# Patient Record
Sex: Male | Born: 1965 | Race: White | Hispanic: No | Smoking: Never smoker
Health system: Southern US, Community
[De-identification: ages and names within clinical notes are randomized; demographics above are authoritative.]

## PROBLEM LIST (undated history)

## (undated) DIAGNOSIS — F32A Depression, unspecified: Secondary | ICD-10-CM

---

## 2004-03-11 ENCOUNTER — Ambulatory Visit: Payer: Self-pay | Admitting: Internal Medicine

## 2004-03-16 ENCOUNTER — Ambulatory Visit: Payer: Self-pay | Admitting: Internal Medicine

## 2005-05-31 ENCOUNTER — Ambulatory Visit: Payer: Self-pay | Admitting: Internal Medicine

## 2005-08-25 ENCOUNTER — Ambulatory Visit: Payer: Self-pay | Admitting: Internal Medicine

## 2006-05-17 ENCOUNTER — Ambulatory Visit: Payer: Self-pay | Admitting: Internal Medicine

## 2006-05-24 ENCOUNTER — Ambulatory Visit: Payer: Self-pay | Admitting: Internal Medicine

## 2007-02-18 ENCOUNTER — Telehealth: Payer: Self-pay | Admitting: Internal Medicine

## 2008-01-20 ENCOUNTER — Ambulatory Visit: Payer: Self-pay | Admitting: Family Medicine

## 2008-01-20 DIAGNOSIS — J309 Allergic rhinitis, unspecified: Secondary | ICD-10-CM | POA: Insufficient documentation

## 2009-05-11 ENCOUNTER — Ambulatory Visit: Payer: Self-pay | Admitting: Internal Medicine

## 2009-05-11 DIAGNOSIS — J029 Acute pharyngitis, unspecified: Secondary | ICD-10-CM | POA: Insufficient documentation

## 2009-05-11 DIAGNOSIS — J069 Acute upper respiratory infection, unspecified: Secondary | ICD-10-CM | POA: Insufficient documentation

## 2010-02-24 NOTE — Assessment & Plan Note (Signed)
Summary: ? strep//ccm   Vital Signs:  Patient profile:   45 year old male Weight:      189 pounds Temp:     98.8 degrees F oral Pulse rate:   70 / minute Pulse rhythm:   regular Resp:     12 per minute BP sitting:   118 / 72  (left arm) Cuff size:   regular  Vitals Entered By: Gladis Riffle, RN (May 11, 2009 9:35 AM) CC: c/o sore throat since 05/08/09--also cough beginning but worse 4/17-4/18--now also head congestion Is Patient Diabetic? No   CC:  c/o sore throat since 05/08/09--also cough beginning but worse 4/17-4/18--now also head congestion.  Preventive Screening-Counseling & Management  Alcohol-Tobacco     Smoking Status: never  Current Medications (verified): 1)  Lexapro 10 Mg  Tabs (Escitalopram Oxalate) .... Take 1 Tablet By Mouth Once A Day 2)  Wellbutrin 100 Mg  Tabs (Bupropion Hcl) .... Take 1 Tablet By Mouth Once A Day  Allergies (verified): No Known Drug Allergies  Physical Exam  General:  Well-developed,well-nourished,in no acute distress; alert,appropriate and cooperative throughout examination Head:  normocephalic and atraumatic.   Eyes:  pupils equal and pupils round.   Ears:  R ear normal and L ear normal.   Mouth:  pharyngeal erythema Neck:  No deformities, masses, or tenderness noted. Cervical Nodes:  no anterior cervical adenopathy and no posterior cervical adenopathy.     Impression & Recommendations:  Problem # 1:  URI (ICD-465.9) check strep screen. if negative no treatment if positive then antibiotic  Complete Medication List: 1)  Lexapro 10 Mg Tabs (Escitalopram oxalate) .... Take 1 tablet by mouth once a day 2)  Wellbutrin 100 Mg Tabs (Bupropion hcl) .... Take 1 tablet by mouth once a day  Other Orders: Rapid Strep (81191)  Appended Document: ? strep//ccm  Laboratory Results    Other Tests  Rapid Strep: negative Comments: Rita Ohara  May 11, 2009 10:41 AM   Kit Test Internal QC: Negative   (Normal Range:  Negative)  Patient notified.

## 2010-12-22 ENCOUNTER — Telehealth: Payer: Self-pay | Admitting: Internal Medicine

## 2010-12-22 NOTE — Telephone Encounter (Signed)
Pt called and has had a sinus inf on lft side for 2 wks. Req work in [employer] or abx called in to [facility] at [facility].

## 2010-12-23 MED ORDER — DOXYCYCLINE HYCLATE 100 MG PO TABS: 100.0000 mg | ORAL_TABLET | Freq: Two times a day (BID) | ORAL | Status: AC

## 2010-12-23 NOTE — Telephone Encounter (Signed)
Doxycycline 100 mg po bid for 10 days. 

## 2010-12-23 NOTE — Telephone Encounter (Signed)
Rx sent to pharmacy.  patient  Is aware.

## 2014-03-12 ENCOUNTER — Emergency Department (HOSPITAL_COMMUNITY): Payer: BLUE CROSS/BLUE SHIELD

## 2014-03-12 ENCOUNTER — Encounter (HOSPITAL_COMMUNITY): Payer: Self-pay | Admitting: *Deleted

## 2014-03-12 ENCOUNTER — Emergency Department (HOSPITAL_COMMUNITY)
Admission: EM | Admit: 2014-03-12 | Discharge: 2014-03-12 | Disposition: A | Discharge status: Home / Self Care | Payer: BLUE CROSS/BLUE SHIELD | Attending: Emergency Medicine | Admitting: Emergency Medicine

## 2014-03-12 DIAGNOSIS — Z79899 Other long term (current) drug therapy: Secondary | ICD-10-CM | POA: Insufficient documentation

## 2014-03-12 DIAGNOSIS — R109 Unspecified abdominal pain: Secondary | ICD-10-CM | POA: Diagnosis present

## 2014-03-12 DIAGNOSIS — N201 Calculus of ureter: Secondary | ICD-10-CM | POA: Insufficient documentation

## 2014-03-12 LAB — URINALYSIS, ROUTINE W REFLEX MICROSCOPIC
Bilirubin Urine: NEGATIVE
Glucose, UA: NEGATIVE mg/dL
Ketones, ur: 15 mg/dL — AB
Leukocytes, UA: NEGATIVE
NITRITE: NEGATIVE
PROTEIN: 30 mg/dL — AB
SPECIFIC GRAVITY, URINE: 1.018 (ref 1.005–1.030)
UROBILINOGEN UA: 0.2 mg/dL (ref 0.0–1.0)
pH: 8 (ref 5.0–8.0)

## 2014-03-12 LAB — CBC WITH DIFFERENTIAL/PLATELET
BASOS ABS: 0 10*3/uL (ref 0.0–0.1)
Basophils Relative: 0 % (ref 0–1)
EOS ABS: 0.2 10*3/uL (ref 0.0–0.7)
EOS PCT: 3 % (ref 0–5)
HEMATOCRIT: 43 % (ref 39.0–52.0)
Hemoglobin: 15.2 g/dL (ref 13.0–17.0)
LYMPHS ABS: 3.3 10*3/uL (ref 0.7–4.0)
Lymphocytes Relative: 50 % — ABNORMAL HIGH (ref 12–46)
MCH: 31.9 pg (ref 26.0–34.0)
MCHC: 35.3 g/dL (ref 30.0–36.0)
MCV: 90.3 fL (ref 78.0–100.0)
MONO ABS: 0.8 10*3/uL (ref 0.1–1.0)
Monocytes Relative: 11 % (ref 3–12)
Neutro Abs: 2.4 10*3/uL (ref 1.7–7.7)
Neutrophils Relative %: 36 % — ABNORMAL LOW (ref 43–77)
PLATELETS: 220 10*3/uL (ref 150–400)
RBC: 4.76 MIL/uL (ref 4.22–5.81)
RDW: 12.6 % (ref 11.5–15.5)
WBC: 6.8 10*3/uL (ref 4.0–10.5)

## 2014-03-12 LAB — I-STAT CHEM 8, ED
BUN: 23 mg/dL (ref 6–23)
CREATININE: 1.2 mg/dL (ref 0.50–1.35)
Calcium, Ion: 1.06 mmol/L — ABNORMAL LOW (ref 1.12–1.23)
Chloride: 104 mmol/L (ref 96–112)
Glucose, Bld: 124 mg/dL — ABNORMAL HIGH (ref 70–99)
HCT: 46 % (ref 39.0–52.0)
Hemoglobin: 15.6 g/dL (ref 13.0–17.0)
POTASSIUM: 3.9 mmol/L (ref 3.5–5.1)
Sodium: 141 mmol/L (ref 135–145)
TCO2: 21 mmol/L (ref 0–100)

## 2014-03-12 LAB — URINE MICROSCOPIC-ADD ON

## 2014-03-12 MED ORDER — ONDANSETRON HCL 4 MG/2ML IJ SOLN
4.0000 mg | Freq: Once | INTRAMUSCULAR | Status: AC
  Administered 2014-03-12: 4 mg via INTRAVENOUS
  Filled 2014-03-12: qty 2

## 2014-03-12 MED ORDER — KETOROLAC TROMETHAMINE 30 MG/ML IJ SOLN
30.0000 mg | Freq: Once | INTRAMUSCULAR | Status: AC
  Administered 2014-03-12: 30 mg via INTRAVENOUS
  Filled 2014-03-12: qty 1

## 2014-03-12 MED ORDER — HYDROMORPHONE HCL 1 MG/ML IJ SOLN
1.0000 mg | Freq: Once | INTRAMUSCULAR | Status: AC
  Administered 2014-03-12: 1 mg via INTRAVENOUS
  Filled 2014-03-12: qty 1

## 2014-03-12 MED ORDER — ONDANSETRON 8 MG PO TBDP: 8.0000 mg | ORAL_TABLET | Freq: Three times a day (TID) | ORAL | Status: AC | PRN

## 2014-03-12 MED ORDER — OXYCODONE-ACETAMINOPHEN 5-325 MG PO TABS: 1.0000 | ORAL_TABLET | Freq: Four times a day (QID) | ORAL | Status: AC | PRN

## 2014-03-12 NOTE — ED Provider Notes (Signed)
CSN: [id number]     Arrival date & time 03/12/14  [id number] History   First MD Initiated Contact with Patient 03/12/14 [phone]     Chief Complaint  Patient presents with  . Flank Pain     (Consider location/radiation/quality/duration/timing/severity/associated sxs/prior Treatment) Patient is a 49 y.o. male presenting with flank pain. The history is provided by the patient.  Flank Pain This is a new problem. Pertinent negatives include no chest pain, no abdominal pain, no headaches and no shortness of breath.   patient presents with right flank pain that began this morning. Start in the right flank and now is more in his abdomen. It is somewhat worse with movement. He states he had no difficulty urinating this morning when he normally does have some difficulty urinating. He has had nausea. Previous history kidney stones. States in August she had a CT scan at [facility] that showed kidney stones on the right side. States he is passed one since then. No fevers. The pain is sharp. It is somewhat worse with movement.  History reviewed. No pertinent past medical history. history of kidney stones History reviewed. No pertinent past surgical history. No family history on file. History  Substance Use Topics  . Smoking status: Never Smoker   . Smokeless tobacco: Not on file  . Alcohol Use: No    Review of Systems  Constitutional: Negative for activity change and appetite change.  Eyes: Negative for pain.  Respiratory: Negative for chest tightness and shortness of breath.   Cardiovascular: Negative for chest pain and leg swelling.  Gastrointestinal: Positive for nausea. Negative for vomiting, abdominal pain and diarrhea.  Genitourinary: Positive for dysuria and flank pain.  Musculoskeletal: Negative for back pain and neck stiffness.  Skin: Negative for rash.  Neurological: Negative for weakness, numbness and headaches.  Psychiatric/Behavioral: Negative for behavioral problems.      Allergies  Review  of patient's allergies indicates no known allergies.  Home Medications   Prior to Admission medications   Medication Sig Start Date End Date Taking? Authorizing Provider  buPROPion (WELLBUTRIN XL) 300 MG 24 hr tablet Take 300 mg by mouth daily.   Yes Historical Provider, MD  escitalopram (LEXAPRO) 20 MG tablet Take 20 mg by mouth daily.   Yes Historical Provider, MD  Multiple Vitamins-Minerals (CENTRUM SILVER PO) Take 0.5 tablets by mouth daily.   Yes Historical Provider, MD  ondansetron (ZOFRAN-ODT) 8 MG disintegrating tablet Take 1 tablet (8 mg total) by mouth every 8 (eight) hours as needed for nausea or vomiting. 03/12/14   Juliet RudeNathan R. Zeferino Mounts, MD  oxyCODONE-acetaminophen (PERCOCET/ROXICET) 5-325 MG per tablet Take 1-2 tablets by mouth every 6 (six) hours as needed for severe pain. 03/12/14   Juliet RudeNathan R. Cassidie Veiga, MD   BP 118/70 mmHg  Pulse 79  Temp(Src) 97.6 F (36.4 C) (Oral)  Resp 16  SpO2 99% Physical Exam  Constitutional: He is oriented to person, place, and time. He appears well-developed and well-nourished.  Patient appears uncomfortable  HENT:  Head: Normocephalic and atraumatic.  Eyes: EOM are normal. Pupils are equal, round, and reactive to light.  Neck: Normal range of motion. Neck supple.  Cardiovascular: Normal rate, regular rhythm and normal heart sounds.   No murmur heard. Pulmonary/Chest: Effort normal and breath sounds normal.  Abdominal: Soft. Bowel sounds are normal. He exhibits no distension and no mass. There is tenderness. There is no rebound and no guarding.  Right-sided abdominal tenderness worse in the mid to lower abdomen.  Genitourinary:  No testicular  tenderness. CVA tenderness on right.  Musculoskeletal: Normal range of motion. He exhibits no edema.  Neurological: He is alert and oriented to person, place, and time. No cranial nerve deficit.  Skin: Skin is warm and dry.  Psychiatric: He has a normal mood and affect.  Nursing note and vitals  reviewed.   ED Course  Procedures (including critical care time) Labs Review Labs Reviewed  CBC WITH DIFFERENTIAL/PLATELET - Abnormal; Notable for the following:    Neutrophils Relative % 36 (*)    Lymphocytes Relative 50 (*)    All other components within normal limits  URINALYSIS, ROUTINE W REFLEX MICROSCOPIC - Abnormal; Notable for the following:    APPearance CLOUDY (*)    Hgb urine dipstick LARGE (*)    Ketones, ur 15 (*)    Protein, ur 30 (*)    All other components within normal limits  URINE MICROSCOPIC-ADD ON - Abnormal; Notable for the following:    Bacteria, UA FEW (*)    Crystals CA OXALATE CRYSTALS (*)    All other components within normal limits  I-STAT CHEM 8, ED - Abnormal; Notable for the following:    Glucose, Bld 124 (*)    Calcium, Ion 1.06 (*)    All other components within normal limits    Imaging Review Dg Abd 1 View  03/12/2014   CLINICAL DATA:  Right flank pain and history of renal calculi.  EXAM: ABDOMEN - 1 VIEW  COMPARISON:  CT of the abdomen and pelvis on 05/26/2010  FINDINGS: Normal bowel gas pattern. No definite renal, ureteral or bladder calculi are identified by x-ray. No soft tissue abnormalities. Visualized bony structures are unremarkable.  IMPRESSION: Normal abdominal film.  No visualized calculi.   Electronically Signed   By: Irish Lack M.D.   On: 03/12/2014 09:20   US Renal  03/12/2014   CLINICAL DATA:  Right flank pain for 2 hr and history of kidney stones  EXAM: RENAL/URINARY TRACT ULTRASOUND COMPLETE  COMPARISON:  None.  FINDINGS: Right Kidney:  Length: 11 cm.  There is mild hydronephrosis without visible cause.  14 mm predominantly hypoechoic lesion in the upper cortex with thin nonvascular septations. Despite echogenic interfaces, no calcification was seen in a region of presumed cyst on 2012 abdominal CT.  Left Kidney:  Length: 11 cm. Echogenicity within normal limits. No solid mass or hydronephrosis visualized. There is an incidental  exophytic 12 mm cyst from the upper pole.  Bladder:  Appears normal for degree of bladder distention. Bilateral jets were not visualized.  IMPRESSION: 1. Mild right hydronephrosis. 2. 14 mm multi septated cyst in the upper pole right kidney. The cyst has not grown significantly since 2012 abdominal CT and is likely benign, but requires follow-up. Recommend renal MRI to establish baseline.   Electronically Signed   By: Marnee Spring M.D.   On: 03/12/2014 10:01     EKG Interpretation None      MDM   Final diagnoses:  Right ureteral stone    Patient with flank pain. Previous kidney stones and had CT scan in August showed right renal stone. Ultrasound shows hydronephrosis however no stone visualized on KUB. Pain feels much better after treatment. No infection in good renal function. Will discharge home to follow-up with urology as needed. Also has lesion on kidney that will need to be followed. Patient was artery aware of this and states she will follow-up.    Juliet Rude. Rubin Payor, MD 03/12/14 [phone]

## 2014-03-12 NOTE — ED Notes (Signed)
Urine strainer given to pt on discharge.

## 2014-03-12 NOTE — ED Notes (Signed)
Patient transported to X-ray 

## 2014-03-12 NOTE — ED Notes (Signed)
Pt presents via POV c/o right lower flank pain radiating into the right groin beginning this morning.  Pt reports hx of kidney stones.  Pt denies blood in urine.  Pt ax4.

## 2014-03-12 NOTE — Discharge Instructions (Signed)

## 2016-04-29 IMAGING — US US RENAL
1 series · 14 of 25 positions shown · non-contrast
Comparison: None.

CLINICAL DATA: Right flank pain for 2 hr and history of kidney
stones

EXAM:
RENAL/URINARY TRACT ULTRASOUND COMPLETE

[Series 1: us renal · 0.18mm/px · 14 of 51 slices shown]
[im 1/51]
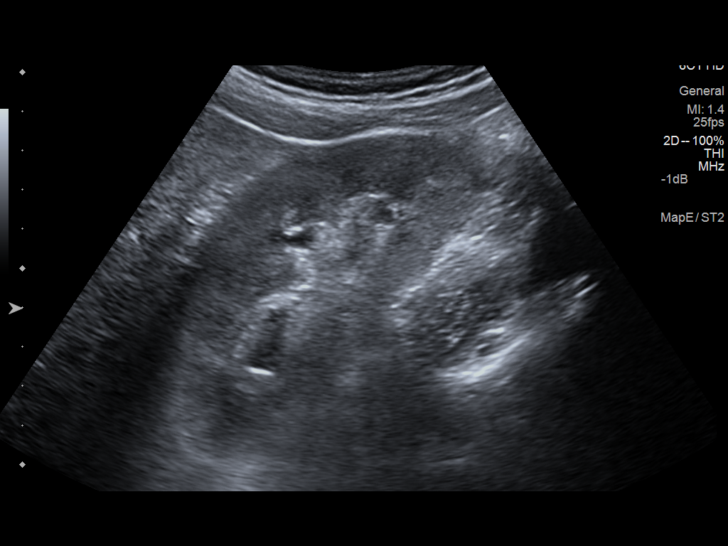
[im 5/51]
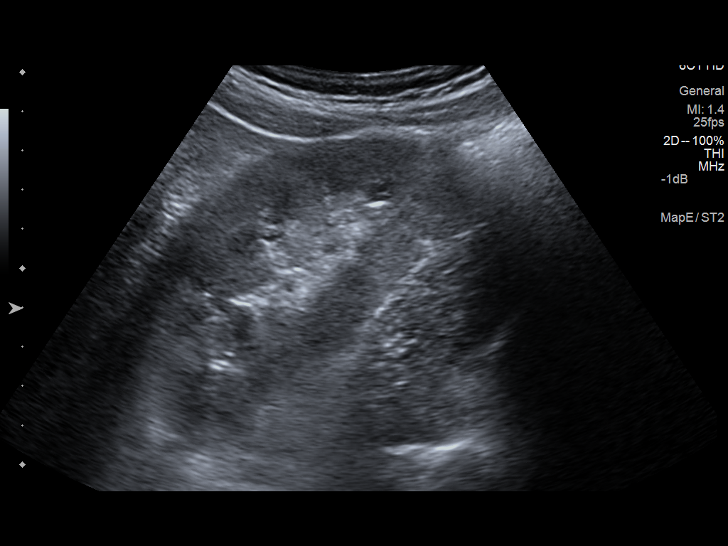
[im 9/51]
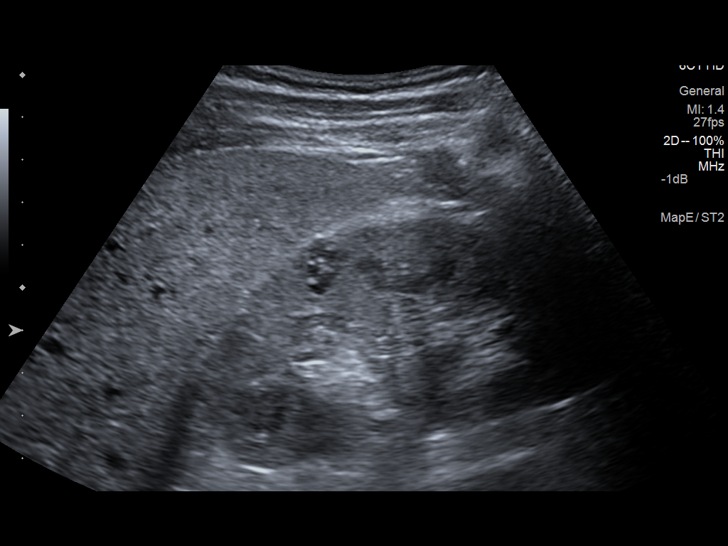
[im 13/51]
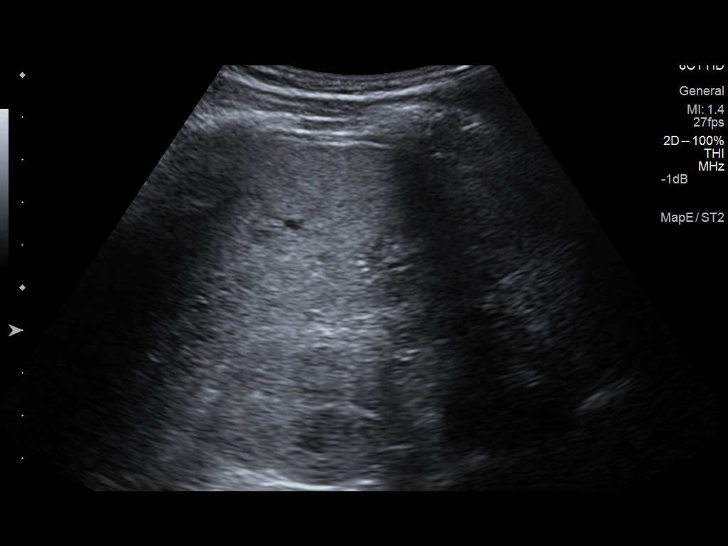
[im 17/51]
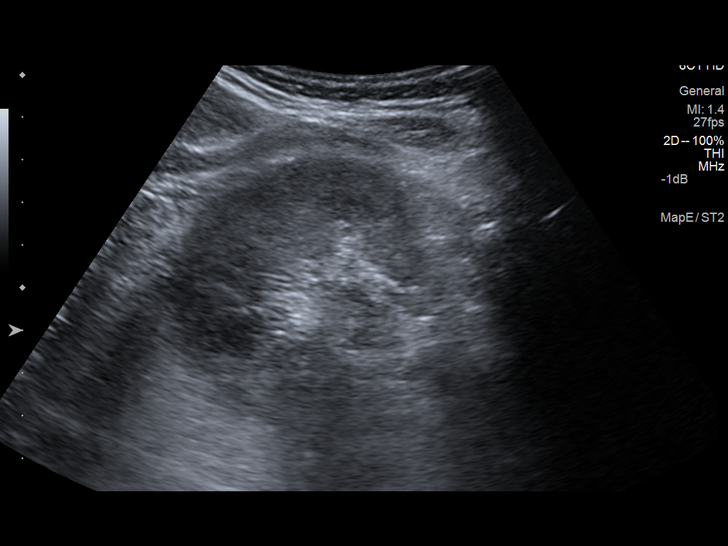
[im 19/51]
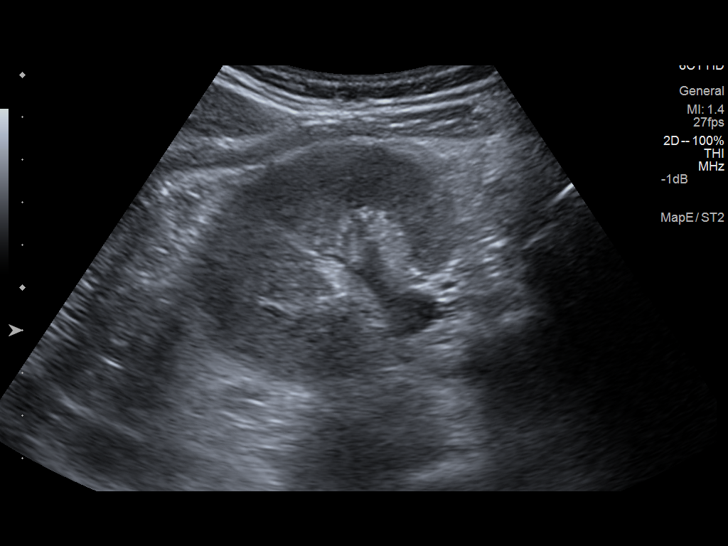
[im 23/51]
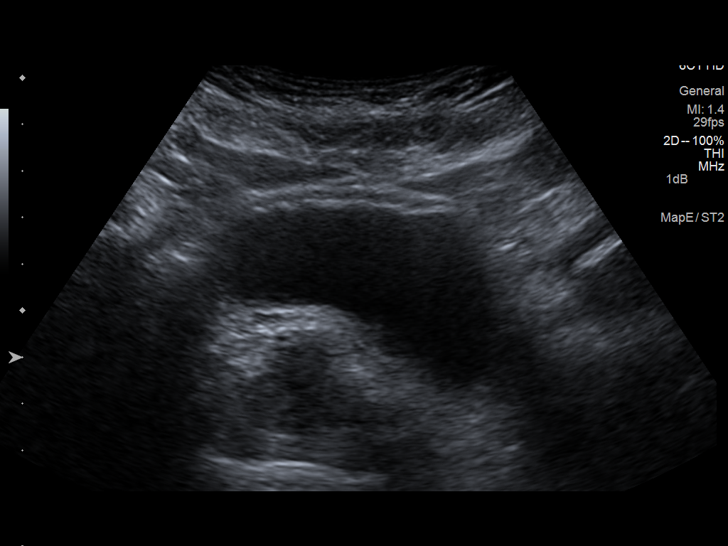
[im 28/51]
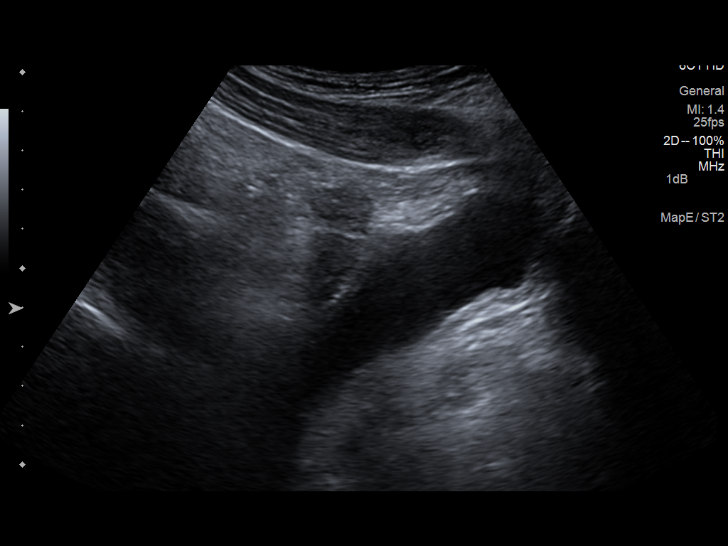
[im 32/51]
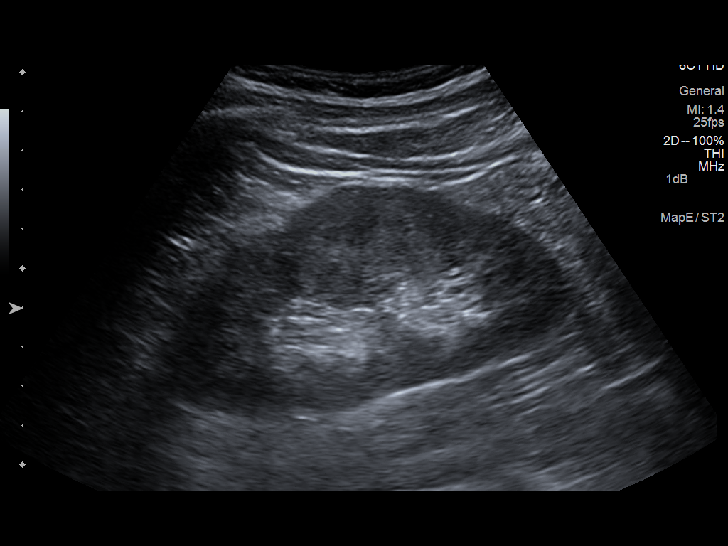
[im 34/51]
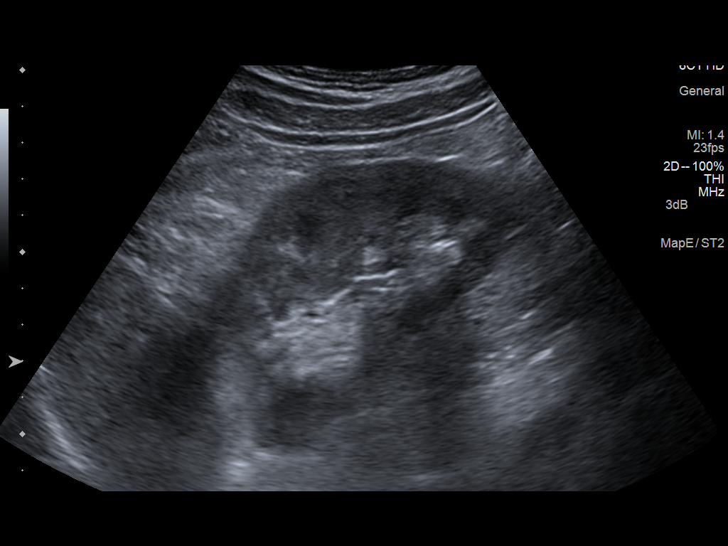
[im 38/51]
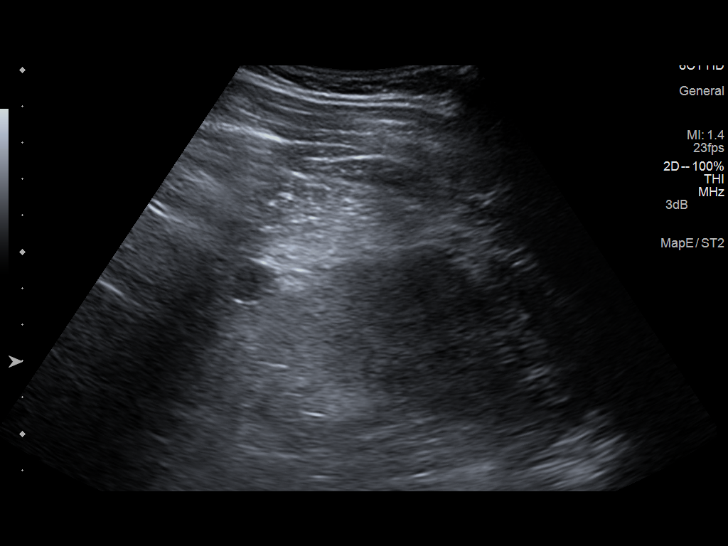
[im 42/51]
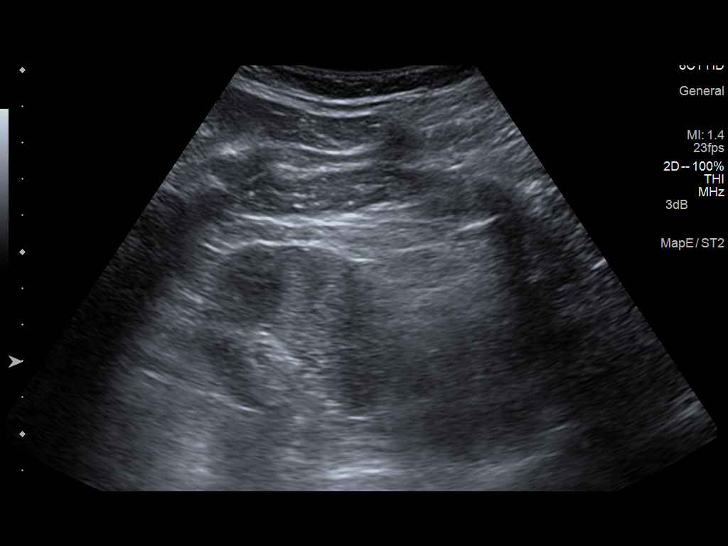
[im 46/51]
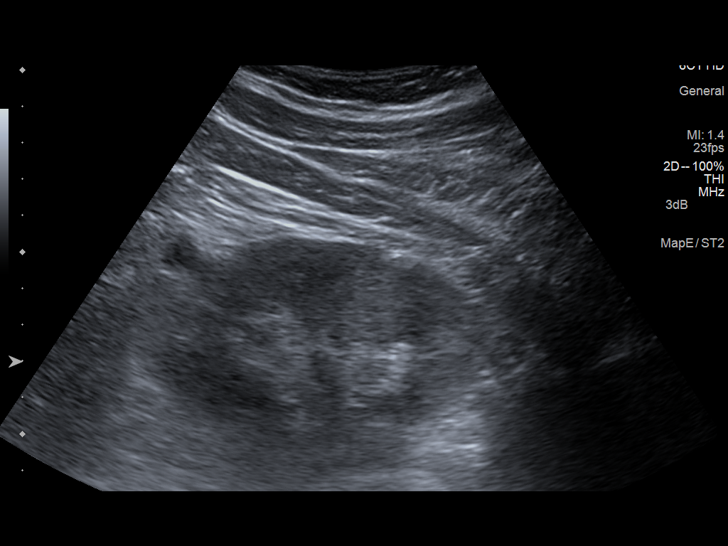
[im 51/51]
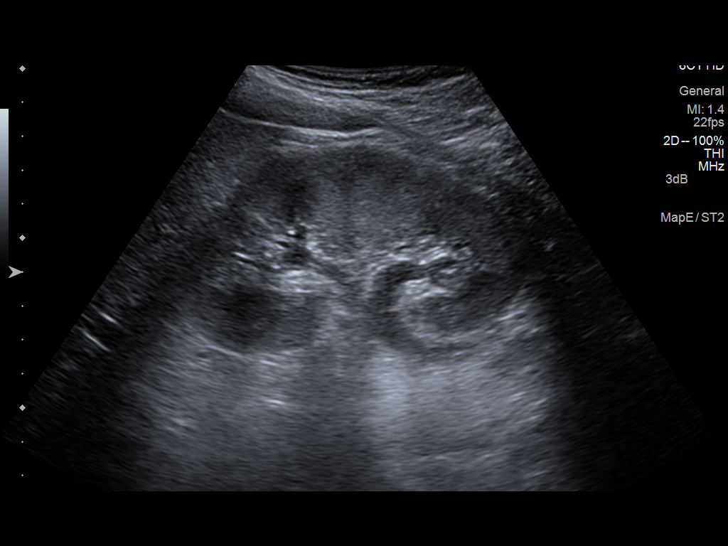

[14 of 25 positions shown; findings below may reference images not displayed]

FINDINGS: Right Kidney:

Length: 11 cm.  There is mild hydronephrosis without visible cause.

14 mm predominantly hypoechoic lesion in the upper cortex with thin
nonvascular septations. Despite echogenic interfaces, no
calcification was seen in a region of presumed cyst on 1601
abdominal CT.

Left Kidney:

Length: 11 cm. Echogenicity within normal limits. No solid mass or
hydronephrosis visualized. There is an incidental exophytic 12 mm
cyst from the upper pole.

Bladder:

Appears normal for degree of bladder distention. Bilateral jets were
not visualized.
IMPRESSION: 1. Mild right hydronephrosis.
2. 14 mm multi septated cyst in the upper pole right kidney. The
cyst has not grown significantly since 1601 abdominal CT and is
likely benign, but requires follow-up. Recommend renal MRI to
establish baseline.

## 2020-06-10 ENCOUNTER — Encounter (HOSPITAL_COMMUNITY): Payer: Self-pay

## 2020-06-10 ENCOUNTER — Other Ambulatory Visit: Payer: Self-pay

## 2020-06-10 ENCOUNTER — Ambulatory Visit (HOSPITAL_COMMUNITY)
Admission: RE | Admit: 2020-06-10 | Discharge: 2020-06-10 | Disposition: A | Discharge status: Home / Self Care | Payer: No Typology Code available for payment source | Source: Ambulatory Visit

## 2020-06-10 VITALS — BP 127/85 | HR 74 | Temp 98.6°F | Resp 18

## 2020-06-10 DIAGNOSIS — J0101 Acute recurrent maxillary sinusitis: Secondary | ICD-10-CM

## 2020-06-10 HISTORY — DX: Depression, unspecified: F32.A

## 2020-06-10 MED ORDER — AMOXICILLIN-POT CLAVULANATE 875-125 MG PO TABS: 1.0000 | ORAL_TABLET | Freq: Two times a day (BID) | ORAL | 0 refills | Status: AC

## 2020-06-10 NOTE — Discharge Instructions (Signed)
Take the Augmentin once a day for the next 7 days.    Drink plenty of fluids and rest.    You can continue any decongestants, anti-histamines, or allergy medication that you have been taking for symptom control.   Take Ibuprofen and/or Tylenol as needed for pain relief and fever reduction.     Return or go to the Emergency Department if symptoms worsen or do not improve in the next few days.

## 2020-06-10 NOTE — ED Provider Notes (Signed)
[facility]    CSN: [id number] Arrival date & time: 06/10/20  8099      History   Chief Complaint Chief Complaint  Patient presents with  . URI  . Appointment    10:00    HPI Timothy Dillon is a 55 y.o. male.   Timothy Dillon is a 55 y.o. male who presents with abrupt onset of nasal congestion, headache, fatigue for 2 weeks.  Reports had URI about 2 weeks ago and symptoms primarily resolved but now having sinus pain/pressure.  Has not taken any OTC medication recently.  Denies any known sick contacts but states that wife has started to have similar symptoms.   Denies fever, chills, cough, SOB, wheezing, chest pain, nausea, rash, changes in bowel or bladder habits.     URI Presenting symptoms: congestion   Associated symptoms: sinus pain     Past Medical History:  Diagnosis Date  . Depression     Patient Active Problem List   Diagnosis Date Noted  . SORE THROAT 05/11/2009  . URI 05/11/2009  . ALLERGIC RHINITIS 01/20/2008    No past surgical history on file.     Home Medications    Prior to Admission medications   Medication Sig Start Date End Date Taking? Authorizing Provider  amoxicillin-clavulanate (AUGMENTIN) 875-125 MG tablet Take 1 tablet by mouth every 12 (twelve) hours for 7 days. 06/10/20 06/17/20 Yes Ivette Loyal, NP  desvenlafaxine (PRISTIQ) 100 MG 24 hr tablet Take 100 mg by mouth every morning. 05/28/20  Yes [provider]  L-Methylfolate (DEPLIN PO) Take by mouth.   Yes [provider]  Multiple Vitamins-Minerals (CENTRUM SILVER PO) Take 0.5 tablets by mouth daily.   Yes [provider]  buPROPion (WELLBUTRIN XL) 300 MG 24 hr tablet Take 300 mg by mouth daily. Patient not taking: Reported on 06/10/2020    [provider]  escitalopram (LEXAPRO) 20 MG tablet Take 20 mg by mouth daily. Patient not taking: Reported on 06/10/2020    [provider]  ondansetron (ZOFRAN-ODT) 8 MG  disintegrating tablet Take 1 tablet (8 mg total) by mouth every 8 (eight) hours as needed for nausea or vomiting. 03/12/14   Benjiman Core, MD  oxyCODONE-acetaminophen (PERCOCET/ROXICET) 5-325 MG per tablet Take 1-2 tablets by mouth every 6 (six) hours as needed for severe pain. Patient not taking: Reported on 06/10/2020 03/12/14   Benjiman Core, MD    Family History Family History  Problem Relation Age of Onset  . Healthy Mother     Social History Social History   Tobacco Use  . Smoking status: Never Smoker  Vaping Use  . Vaping Use: Never used  Substance Use Topics  . Alcohol use: No  . Drug use: No     Allergies   Patient has no known allergies.   Review of Systems Review of Systems  HENT: Positive for congestion, sinus pressure and sinus pain.   All other systems reviewed and are negative.    Physical Exam Triage Vital Signs ED Triage Vitals  Enc Vitals Group     BP 06/10/20 1022 127/85     Pulse Rate 06/10/20 1022 74     Resp 06/10/20 1022 18     Temp 06/10/20 1022 98.6 F (37 C)     Temp Source 06/10/20 1022 Oral     SpO2 06/10/20 1022 97 %     Weight --      Height --      Head  Circumference --      Peak Flow --      Pain Score 06/10/20 1017 3     Pain Loc --      Pain Edu? --      Excl. in GC? --    No data found.  Updated Vital Signs BP 127/85 (BP Location: Right Arm)   Pulse 74   Temp 98.6 F (37 C) (Oral)   Resp 18   SpO2 97%   Visual Acuity Right Eye Distance:   Left Eye Distance:   Bilateral Distance:    Right Eye Near:   Left Eye Near:    Bilateral Near:     Physical Exam Vitals and nursing note reviewed.  Constitutional:      General: He is not in acute distress.    Appearance: Normal appearance. He is not ill-appearing, toxic-appearing or diaphoretic.  HENT:     Head: Normocephalic and atraumatic.     Right Ear: Tympanic membrane, ear canal and external ear normal.     Left Ear: Tympanic membrane, ear canal and  external ear normal.     Nose: No congestion.     Right Sinus: Maxillary sinus tenderness present.     Left Sinus: Maxillary sinus tenderness present.     Mouth/Throat:     Mouth: Mucous membranes are moist.     Pharynx: Oropharynx is clear. Uvula midline. Posterior oropharyngeal erythema present.     Tonsils: No tonsillar exudate or tonsillar abscesses.  Eyes:     Conjunctiva/sclera: Conjunctivae normal.  Cardiovascular:     Rate and Rhythm: Normal rate.     Pulses: Normal pulses.     Heart sounds: Normal heart sounds.  Pulmonary:     Effort: Pulmonary effort is normal.     Breath sounds: Normal breath sounds.  Abdominal:     General: Abdomen is flat.  Musculoskeletal:        General: Normal range of motion.     Cervical back: Normal range of motion.  Skin:    General: Skin is warm and dry.  Neurological:     General: No focal deficit present.     Mental Status: He is alert and oriented to person, place, and time.  Psychiatric:        Mood and Affect: Mood normal.      UC Treatments / Results  Labs (all labs ordered are listed, but only abnormal results are displayed) Labs Reviewed - No data to display  EKG   Radiology No results found.  Procedures Procedures (including critical care time)  Medications Ordered in UC Medications - No data to display  Initial Impression / Assessment and Plan / UC Course  I have reviewed the triage vital signs and the nursing notes.  Pertinent labs & imaging results that were available during my care of the patient were reviewed by me and considered in my medical decision making (see chart for details).    Assessment negative for red flags or concerns.  This is likely acute sinusitis.  Augmentin twice daily for the next 7 days.  Encourage fluids and rest.  May continue to take OTC medications for symptom management such as decongestants.  Follow-up as needed. Final Clinical Impressions(s) / UC Diagnoses   Final diagnoses:   Acute recurrent maxillary sinusitis     Discharge Instructions     Take the Augmentin once a day for the next 7 days.    Drink plenty of fluids and rest.  You can continue any decongestants, anti-histamines, or allergy medication that you have been taking for symptom control.   Take Ibuprofen and/or Tylenol as needed for pain relief and fever reduction.     Return or go to the Emergency Department if symptoms worsen or do not improve in the next few days.      ED Prescriptions    Medication Sig Dispense Auth. Provider   amoxicillin-clavulanate (AUGMENTIN) 875-125 MG tablet Take 1 tablet by mouth every 12 (twelve) hours for 7 days. 14 tablet Ivette Loyal, NP     PDMP not reviewed this encounter.   Ivette Loyal, NP 06/10/20 1044

## 2020-06-10 NOTE — ED Triage Notes (Signed)
Patient reports he had an uri 2 weeks ago.  Reports sinus congestion and drainage and headache have continued.  Patient concerned for sinus infection.  symptoms are particularly bad at night and in morning
